# Patient Record
Sex: Female | Born: 2000
Health system: Southern US, Community
[De-identification: ages and names within clinical notes are randomized; demographics above are authoritative.]

## PROBLEM LIST (undated history)

## (undated) DIAGNOSIS — F419 Anxiety disorder, unspecified: Secondary | ICD-10-CM

---
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
## 2020-01-05 ENCOUNTER — Encounter: Payer: Self-pay | Admitting: Neurology

## 2020-01-05 ENCOUNTER — Other Ambulatory Visit: Payer: Self-pay

## 2020-01-05 ENCOUNTER — Ambulatory Visit: Payer: 59 | Admitting: Neurology

## 2020-01-05 VITALS — BP 124/72 | HR 100 | Temp 97.6°F | Ht 64.0 in | Wt 134.0 lb

## 2020-01-05 DIAGNOSIS — H532 Diplopia: Secondary | ICD-10-CM

## 2020-01-05 NOTE — Progress Notes (Signed)
Reason for visit: Diplopia  Referring physician: Dr. Glade Lloyd is a 19 y.o. female  History of present illness:  Ms. Lapage is a 54 year old right-handed white female with a 1 year onset of gradual worsening double vision.  The patient has more double vision when she tries to look up, she may have a component of horizontal and vertical changes.  The patient denies any significant issues with eye pain but she does have problems with headaches that are daily in nature and have been present for about a year concurrent with onset of the double vision.  She reports some underlying fatigue issues, she denies any numbness or weakness of the face, arms, legs.  She has noted that the interpalpebral fissure on the left eye is wider than the right.  This also started about 1 year ago.  The patient did have problems with strabismus as a child, this was corrected by using glasses.  The patient denies any speech or swallowing changes, she does report occasional night sweats.  She reports that she has always been a bit clumsy, she has not noted any change in balance or difficulty controlling the bowels or the bladder.  She has already undergone blood work to look for myasthenia gravis and TSH level was normal.  Acetylcholine receptor antibody panel was negative.  Past Medical History:  Diagnosis Date  . Anxiety     History reviewed. No pertinent surgical history.  Family History  Problem Relation Age of Onset  . Cancer Mother        Sarcoma    Social history:  reports that she has never smoked. She has never used smokeless tobacco. She reports that she does not drink alcohol or use drugs.  Medications:  Prior to Admission medications   Medication Sig Start Date End Date Taking? Authorizing Provider  amoxicillin (AMOXIL) 875 MG tablet Take 1 tablet (875 mg total) by mouth 2 (two) times daily. (Rx void after 07/27/18) 07/17/18  Yes Beese, Tera Mater, MD  benzonatate (TESSALON) 200 MG capsule  Take one cap by mouth at bedtime as needed for cough.  May repeat in 4 to 6 hours 07/17/18  Yes Beese, Tera Mater, MD  cetirizine (ZYRTEC) 10 MG tablet Take by mouth.   Yes [provider]  norgestimate-ethinyl estradiol (ORTHO-CYCLEN,SPRINTEC,PREVIFEM) 0.25-35 MG-MCG tablet Take 1 tablet by mouth daily.   Yes [provider]  ondansetron (ZOFRAN ODT) 4 MG disintegrating tablet Take one tab by mouth Q6hr prn nausea.  Dissolve under tongue. 07/17/18  Yes Lattie Haw, MD  sertraline (ZOLOFT) 25 MG tablet Take 25 mg by mouth daily.   Yes [provider]  triamcinolone cream (KENALOG) 0.1 % Apply 1 application topically 2 (two) times daily. 06/25/18  Yes Phelps, Erin O, PA-C     No Known Allergies  ROS:  Out of a complete 14 system review of symptoms, the patient complains only of the following symptoms, and all other reviewed systems are negative.  Double vision Fatigue Headache  Blood pressure 124/72, pulse 100, temperature 97.6 F (36.4 C), height 5\' 4"  (1.626 m), weight 134 lb (60.8 kg).  Physical Exam  General: The patient is alert and cooperative at the time of the examination.  Eyes: Pupils are equal, round, and reactive to light. Discs are flat bilaterally.  Venous pulsations are noted.  Neck: The neck is supple, no carotid bruits are noted.  Respiratory: The respiratory examination is clear.  Cardiovascular: The cardiovascular examination reveals a  regular rate and rhythm, no obvious murmurs or rubs are noted.  Skin: Extremities are without significant edema.  Neurologic Exam  Mental status: The patient is alert and oriented x 3 at the time of the examination. The patient has apparent normal recent and remote memory, with an apparently normal attention span and concentration ability.  Cranial nerves: Facial symmetry is present. There is good sensation of the face to pinprick and soft touch bilaterally. The strength of the facial muscles and the  muscles to head turning and shoulder shrug are normal bilaterally. Speech is well enunciated, no aphasia or dysarthria is noted. Extraocular movements are notable in that the left eye is inferior to the right in all fields, with primary gaze the eyes appear to be more conjugate but significant separation is seen with lateral gaze and superior gaze. Visual fields are full. The tongue is midline, and the patient has symmetric elevation of the soft palate. No obvious hearing deficits are noted.  Motor: The motor testing reveals 5 over 5 strength of all 4 extremities. Good symmetric motor tone is noted throughout.  Sensory: Sensory testing is intact to pinprick, soft touch, vibration sensation, and position sense on all 4 extremities. No evidence of extinction is noted.  Coordination: Cerebellar testing reveals good finger-nose-finger and heel-to-shin bilaterally.  Gait and station: Gait is normal. Tandem gait is normal. Romberg is negative. No drift is seen.  Reflexes: Deep tendon reflexes are symmetric, but are somewhat brisk bilaterally. Toes are downgoing bilaterally.   Assessment/Plan:  1.  Diplopia  2.  Questionable mild proptosis, OS  The patient will be sent for further blood work today.  MRI of the brain will be done.  If the studies are unrevealing, I will consider an empiric trial on Mestinon, although the clinical history and examination do not appear to be typical for myasthenia gravis.  Need to consider rule out the possibility of demyelinating disease.  She will follow-up here in about 3 months.   Jill Alexanders MD 01/05/2020 3:55 PM  [facility] [address] [place], [place] [address]  Phone [phone] Fax [phone]

## 2020-01-09 ENCOUNTER — Telehealth: Payer: Self-pay | Admitting: Neurology

## 2020-01-09 LAB — COMPREHENSIVE METABOLIC PANEL
ALT: 9 IU/L (ref 0–32)
AST: 15 IU/L (ref 0–40)
Albumin/Globulin Ratio: 1.5 (ref 1.2–2.2)
Albumin: 4.6 g/dL (ref 3.9–5.0)
Alkaline Phosphatase: 60 IU/L (ref 39–117)
BUN/Creatinine Ratio: 13 (ref 9–23)
BUN: 10 mg/dL (ref 6–20)
Bilirubin Total: 0.2 mg/dL (ref 0.0–1.2)
CO2: 23 mmol/L (ref 20–29)
Calcium: 10.1 mg/dL (ref 8.7–10.2)
Chloride: 101 mmol/L (ref 96–106)
Creatinine, Ser: 0.79 mg/dL (ref 0.57–1.00)
GFR calc Af Amer: 125 mL/min/{1.73_m2} (ref >59)
GFR calc non Af Amer: 109 mL/min/{1.73_m2} (ref >59)
Globulin, Total: 3 g/dL (ref 1.5–4.5)
Glucose: 92 mg/dL (ref 65–99)
Potassium: 4.1 mmol/L (ref 3.5–5.2)
Sodium: 139 mmol/L (ref 134–144)
Total Protein: 7.6 g/dL (ref 6.0–8.5)

## 2020-01-09 LAB — PAN-ANCA
ANCA Proteinase 3: <3.5 U/mL (ref 0.0–3.5)
Atypical pANCA: <1:20 {titer}
C-ANCA: <1:20 {titer}
Myeloperoxidase Ab: <9 U/mL (ref 0.0–9.0)
P-ANCA: <1:20 {titer}

## 2020-01-09 LAB — THYROID PEROXIDASE ANTIBODY: Thyroperoxidase Ab SerPl-aCnc: 52 IU/mL — ABNORMAL HIGH (ref 0–26)

## 2020-01-09 LAB — ANA W/REFLEX: Anti Nuclear Antibody (ANA): NEGATIVE

## 2020-01-09 LAB — B. BURGDORFI ANTIBODIES: Lyme IgG/IgM Ab: <0.91 {ISR} (ref 0.00–0.90)

## 2020-01-09 LAB — THYROGLOBULIN ANTIBODY: Thyroglobulin Antibody: 1.6 IU/mL — ABNORMAL HIGH (ref 0.0–0.9)

## 2020-01-09 LAB — VITAMIN B12: Vitamin B-12: 327 pg/mL (ref 232–1245)

## 2020-01-09 LAB — ANGIOTENSIN CONVERTING ENZYME: Angio Convert Enzyme: 47 U/L (ref 14–82)

## 2020-01-09 LAB — SEDIMENTATION RATE: Sed Rate: 6 mm/hr (ref 0–32)

## 2020-01-09 NOTE — Telephone Encounter (Signed)
I called the patient, I talked to the mother.  MRI of the brain is pending, blood work shows no abnormalities with exception of a slightly elevated thyroid peroxidase antibody and thyroglobulin antibody, the clinical significance of this is not clear.  If the MRI is unrevealing, I will set her up for second opinion with an endocrinologist.

## 2020-01-10 ENCOUNTER — Telehealth: Payer: Self-pay | Admitting: Neurology

## 2020-01-10 ENCOUNTER — Telehealth: Payer: Self-pay

## 2020-01-10 ENCOUNTER — Other Ambulatory Visit: Payer: Self-pay | Admitting: Neurology

## 2020-01-10 DIAGNOSIS — R768 Other specified abnormal immunological findings in serum: Secondary | ICD-10-CM

## 2020-01-10 NOTE — Telephone Encounter (Signed)
I talk to pts mom about the thyroid antibodies were elevated and Dr.Ahern work in [facility] recommend referral to endocrinology for autoimmune thyroid disease. The mom is in agreement and wants the referral sent. She verbalized understanding.

## 2020-01-10 NOTE — Telephone Encounter (Signed)
[employer] Auth: [employer] via [employer] website order faxed to [facility] imaging. Ph # [phone] & [phone].They will reach out to the patient to schedule.

## 2020-01-10 NOTE — Telephone Encounter (Signed)
Done, thanks

## 2020-01-21 ENCOUNTER — Ambulatory Visit: Payer: 59 | Attending: Internal Medicine

## 2020-01-21 DIAGNOSIS — Z23 Encounter for immunization: Secondary | ICD-10-CM

## 2020-01-21 NOTE — Progress Notes (Signed)
   Covid-19 Vaccination Clinic  Name:  Laura Burgess    MRN: [id number] DOB: 2001-07-29  01/21/2020  Laura Burgess was observed post Covid-19 immunization for 15 minutes without incident. She was provided with Vaccine Information Sheet and instruction to access the V-Safe system.   Laura Burgess was instructed to call 911 with any severe reactions post vaccine: Marland Kitchen Difficulty breathing  . Swelling of face and throat  . A fast heartbeat  . A bad rash all over body  . Dizziness and weakness   Immunizations Administered    Name Date Dose VIS Date Route   Pfizer COVID-19 Vaccine 01/21/2020  9:15 AM 0.3 mL 09/30/2019 Intramuscular   Manufacturer: [employer], [employer]   Lot: [id number]   NDC: [address]-9

## 2020-02-03 ENCOUNTER — Encounter: Payer: Self-pay | Admitting: Internal Medicine

## 2020-02-03 ENCOUNTER — Ambulatory Visit: Payer: 59 | Admitting: Internal Medicine

## 2020-02-03 ENCOUNTER — Other Ambulatory Visit: Payer: Self-pay

## 2020-02-03 VITALS — BP 116/68 | HR 96 | Temp 98.6°F | Ht 64.0 in | Wt 131.2 lb

## 2020-02-03 DIAGNOSIS — E0590 Thyrotoxicosis, unspecified without thyrotoxic crisis or storm: Secondary | ICD-10-CM | POA: Diagnosis not present

## 2020-02-03 DIAGNOSIS — R768 Other specified abnormal immunological findings in serum: Secondary | ICD-10-CM

## 2020-02-03 DIAGNOSIS — H499 Unspecified paralytic strabismus: Secondary | ICD-10-CM

## 2020-02-03 DIAGNOSIS — R7689 Other specified abnormal immunological findings in serum: Secondary | ICD-10-CM | POA: Insufficient documentation

## 2020-02-03 DIAGNOSIS — L80 Vitiligo: Secondary | ICD-10-CM | POA: Diagnosis not present

## 2020-02-03 NOTE — Progress Notes (Signed)
Name: Laura Burgess  MRN/ DOB: [id number], 2000/12/23    Age/ Sex: 19 y.o., female    PCP: Chales Salmon, MD   Reason for Endocrinology Evaluation: Elevated Anti-TPO Antibodies     Date of Initial Endocrinology Evaluation: 02/06/2020     HPI: Laura Burgess is a 19 y.o. female with a past medical history of Anxiety . The patient presented for initial endocrinology clinic visit on 02/06/2020 for consultative assistance with her Elevated Anti- TPO antibody   Pt was noted to have a slight elevation of Anti-TPO antibody during evaluation of headaches and diplopia.   She had fatigue that was started in 2019.  As a child she had right strabismus which was corrected with glasses.  Diplopia early in 01/2019 , this has been progressive  Headaches and eye pain started early in 2020   Weight has been stable  Anxiety is worse  Denies diarrhea  Has tremors worse with anxiety  Has occasional palpitations Denies tingling and numbness     Maternal great grand mother with thyroid disease    HISTORY:  Past Medical History:  Past Medical History:  Diagnosis Date  . Anxiety     Past Surgical History: No past surgical history on file.   Social History:  reports that she has never smoked. She has never used smokeless tobacco. She reports that she does not drink alcohol or use drugs.  Family History: family history includes Cancer in her mother.   HOME MEDICATIONS: Allergies as of 02/03/2020   No Known Allergies     Medication List       Accurate as of February 03, 2020 11:59 PM. If you have any questions, ask your nurse or doctor.        STOP taking these medications   ondansetron 4 MG disintegrating tablet Commonly known as: Zofran ODT Stopped by: Scarlette Shorts, MD     TAKE these medications   sertraline 25 MG tablet Commonly known as: ZOLOFT Take 25 mg by mouth daily.   Sprintec 28 0.25-35 MG-MCG tablet Generic drug: norgestimate-ethinyl estradiol What changed:  Another medication with the same name was removed. Continue taking this medication, and follow the directions you see here. Changed by: Scarlette Shorts, MD   VITAMIN D PO Take 3,000 Units by mouth.         REVIEW OF SYSTEMS: A comprehensive ROS was conducted with the patient and is negative except as per HPI   OBJECTIVE:  VS: BP 116/68 (BP Location: Left Arm, Patient Position: Sitting, Cuff Size: Normal)   Pulse 96   Temp 98.6 F (37 C)   Ht 5\' 4"  (1.626 m)   Wt 131 lb 3.2 oz (59.5 kg)   LMP 01/22/2020 (Exact Date)   SpO2 98%   BMI 22.52 kg/m    Wt Readings from Last 3 Encounters:  02/03/20 131 lb 3.2 oz (59.5 kg) (58 %, Z= 0.21)*  01/05/20 134 lb (60.8 kg) (63 %, Z= 0.34)*  07/17/18 128 lb (58.1 kg) (60 %, Z= 0.24)*   * Growth percentiles are based on CDC (Girls, 2-20 Years) data.     EXAM: General: Pt appears well and is in NAD  Eyes: External eye exam shows a disc conjugate gaze when looking upwards.  Patient with diplopia of the left eye upon abduction and inferior lateral movement. Patient with diplopia of the right eye upon abduction and inferior medial movement  Neck: General: Supple without adenopathy. Thyroid: Thyroid size normal.  No  goiter or nodules appreciated.  Lungs: Clear with good BS bilat with no rales, rhonchi, or wheezes  Heart: Auscultation: RRR.  Abdomen: Normoactive bowel sounds, soft, nontender, without masses or organomegaly palpable  Extremities:  BL LE: No pretibial edema normal ROM and strength.  Skin: Hair: Texture and amount normal with gender appropriate distribution Skin Inspection: No rashes, patient has a hyper pigmented macule on the right shin Skin Palpation: Skin temperature, texture, and thickness normal to palpation  Neuro: Cranial nerves: II - XII grossly intact  Motor: Normal strength throughout DTRs: Brisk reflexes  Mental Status: Judgment, insight: Intact Orientation: Oriented to time, place, and person Mood and  affect: No depression, anxiety, or agitation     DATA REVIEWED:   Results for Laura Burgess (MRN [id number]) as of 02/03/2020 15:34  Ref. Range 01/05/2020 15:59  Thyroperoxidase Ab SerPl-aCnc Latest Ref Range: 0 - 26 IU/mL 52 (H)  Thyroglobulin Antibody Latest Ref Range: 0.0 - 0.9 IU/mL 1.6 (H)  06/08/2018 TSH 0.43 uIU/mL    12/28/2019 TSH 1.35 mIU/mL FT4 1.3 ng/dL  ASSESSMENT/PLAN/RECOMMENDATIONS:   1. Elevated anti-TPO antibodies:  - I explained to the patient that Hashimoto's Disease is an autoimmune - mediated destruction of the thyroid gland. The usual course of Hashimoto's thyroiditis is the gradual loss of thyroid function. Overt hypothyroidism occurs at a rate of ~ 5% per year.   2. Subclinical Hyperthyroidism:   - In 2019 she did have a low TSH with normalization in 12/2029. This could be subclinical Grave's disease vs essay interference , another differential includes subacute thyroiditis , but the clinical scenario is not consistent with this. -No treatment is needed at this time. - TRAb is pending  -Patient is clinically euthyroid  2.  Ophthalmoplegia:  -Patient has been evaluated by neurology, with negative MRI of the brain.  Negative myasthenia gravis antibody levels - Pt states she was told the differential includes graves' disease vs Myasthenia Gravis -I strongly recommend the patient has an orbital MRI or a CT scan, I explained to the patient that orbital imaging  is beyond the scope of endocrinology - We also discussed that the role of endocrinology is to treat Graves' Thyroid disease but  NOT Graves' orbitopathy.    3.  Vitiligo:  Patient does endorse enlargement of the hypopigmented area over the past year.  We discussed that vitiligo is an autoimmune condition , which could mean that she is at risk for developing other autoimmune condition such as Graves' disease, Hashimoto, and myasthenia gravis.   Addendum: Lab results discussed with patient and mother on  02/06/2020 at about 1630.  We discussed that TSH levels are minimally decreased and this is usually not associated with any clinical symptoms.  I again emphasized the importance of seeking ophthalmology consultation for the possibility of Graves' orbitopathy, as this is beyond the scope of endocrinology I will update them when the TSI and TRAB  levels become available, but this is not going to change the course of treatment from the thyroid standpoint.   Repeat labs in 6 weeks  Signed electronically by: Mack Guise, MD  [facility] Endocrin[facility]  [facility] [address], [place] [address] Phone: [phone] FAX: [phone]   CC: [address], [address] [place] [address] Phone: [phone] Fax: [phone]   Return to Endocrinology clinic as below: Future Appointments  Date Time Provider Faith  02/14/2020  9:30 AM [place]

## 2020-02-03 NOTE — Patient Instructions (Signed)
-   I would recommend annual thyroid function checks, this could be done by your primary care physician

## 2020-02-06 ENCOUNTER — Encounter: Payer: Self-pay | Admitting: Internal Medicine

## 2020-02-06 NOTE — Telephone Encounter (Signed)
Spoke to pt and mother and discussed slighly low TSh and normal FT4. No treatment needed at this time . I have encouraged them to discuss an orbital MRI with ophthalmology.   Awaiting on TRab and TSI levels     They both expressed understanding    Abby Raelyn Mora, MD  [facility] Endocrinology  [facility] [address] [place], [place] [address] Phone: [phone] FAX: [phone]

## 2020-02-07 ENCOUNTER — Ambulatory Visit: Payer: 59 | Admitting: Internal Medicine

## 2020-02-07 LAB — TSH: TSH: 0.46 mIU/L — ABNORMAL LOW

## 2020-02-07 LAB — TRAB (TSH RECEPTOR BINDING ANTIBODY): TRAB: <1 IU/L (ref <=2.00)

## 2020-02-07 LAB — THYROID STIMULATING IMMUNOGLOBULIN: TSI: <89 % baseline (ref <140)

## 2020-02-07 LAB — T4, FREE: Free T4: 1.4 ng/dL (ref 0.8–1.4)

## 2020-02-10 ENCOUNTER — Other Ambulatory Visit: Payer: Self-pay | Admitting: Neurology

## 2020-02-10 DIAGNOSIS — H532 Diplopia: Secondary | ICD-10-CM

## 2020-02-13 ENCOUNTER — Other Ambulatory Visit: Payer: Self-pay | Admitting: Neurology

## 2020-02-13 DIAGNOSIS — H532 Diplopia: Secondary | ICD-10-CM

## 2020-02-14 ENCOUNTER — Ambulatory Visit: Payer: 59 | Attending: Internal Medicine

## 2020-02-14 DIAGNOSIS — Z23 Encounter for immunization: Secondary | ICD-10-CM

## 2020-02-14 NOTE — Progress Notes (Signed)
   Covid-19 Vaccination Clinic  Name:  Kaja Jackowski    MRN: [id number] DOB: October 20, 2001  02/14/2020  Ms. Ohnemus was observed post Covid-19 immunization for 15 minutes without incident. She was provided with Vaccine Information Sheet and instruction to access the V-Safe system.   Ms. Raphael was instructed to call 911 with any severe reactions post vaccine: Marland Kitchen Difficulty breathing  . Swelling of face and throat  . A fast heartbeat  . A bad rash all over body  . Dizziness and weakness   Immunizations Administered    Name Date Dose VIS Date Route   Pfizer COVID-19 Vaccine 02/14/2020  9:21 AM 0.3 mL 12/14/2018 Intramuscular   Manufacturer: [employer], [employer]   Lot: [id number]   NDC: [address]-6

## 2020-02-16 ENCOUNTER — Telehealth: Payer: Self-pay | Admitting: Neurology

## 2020-02-16 NOTE — Telephone Encounter (Signed)
UHC Auth: [employer] via UHC website order faxed to [facility]. Ph # [phone] & [phone].They will reach out to the patient to schedule.

## 2020-02-24 ENCOUNTER — Ambulatory Visit: Payer: 59 | Admitting: Internal Medicine

## 2020-07-25 ENCOUNTER — Emergency Department
Admission: RE | Admit: 2020-07-25 | Discharge: 2020-07-25 | Disposition: A | Discharge status: Home / Self Care | Payer: 59 | Source: Ambulatory Visit

## 2020-07-25 ENCOUNTER — Other Ambulatory Visit: Payer: Self-pay

## 2020-07-25 VITALS — BP 101/68 | HR 124 | Temp 98.0°F | Resp 18

## 2020-07-25 DIAGNOSIS — H66002 Acute suppurative otitis media without spontaneous rupture of ear drum, left ear: Secondary | ICD-10-CM

## 2020-07-25 MED ORDER — CEPHALEXIN 500 MG PO CAPS: 500.0000 mg | ORAL_CAPSULE | Freq: Three times a day (TID) | ORAL | 0 refills | Status: AC

## 2020-07-25 MED ORDER — FLUTICASONE PROPIONATE 50 MCG/ACT NA SUSP: 2.0000 | Freq: Every day | NASAL | 12 refills | Status: DC

## 2020-07-25 NOTE — ED Provider Notes (Signed)
[facility]    CSN: [id number] Arrival date & time: 07/25/20  1402      History   Chief Complaint Chief Complaint  Patient presents with  . Otalgia    Bilateral    HPI Laura Burgess is a 19 y.o. female.   HPI  Patient presents with 3 days of bilateral pain with with pain in the left ear pain greater than right ear. Symptoms started 4 days ago.  She also endorses a left sided headache which she attributes to recent infusion she has been receiving her thyroid vaccines.  Denies sinus symptoms, nasal drainage, fever, no concern for COVID-19. Afebrile  Past Medical History:  Diagnosis Date  . Anxiety     Patient Active Problem List   Diagnosis Date Noted  . 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 Vitiligo 02/03/2020  . Anti-TPO antibodies present 02/03/2020    History reviewed. No pertinent surgical history.  OB History   No obstetric history on file.      Home Medications    Prior to Admission medications   Medication Sig Start Date End Date Taking? Authorizing Provider  Teprotumumab-trbw 500 MG SOLR Inject into the vein.   Yes [provider]  norgestimate-ethinyl estradiol (SPRINTEC 28) 0.25-35 MG-MCG tablet     [provider]  sertraline (ZOLOFT) 25 MG tablet Take 25 mg by mouth daily.    [provider]  VITAMIN D PO Take 3,000 Units by mouth.    [provider]    Family History Family History  Problem Relation Age of Onset  . Cancer Mother        Sarcoma    Social History Social History   Tobacco Use  . Smoking status: Never Smoker  . Smokeless tobacco: Never Used  Vaping Use  . Vaping Use: Never used  Substance Use Topics  . Alcohol use: Never  . Drug use: Never     Allergies   Patient has no known allergies. Review of Systems Review of Systems Pertinent negatives listed in HPI  Physical Exam Triage Vital Signs ED Triage Vitals [07/25/20 1418]  Enc Vitals Group     BP 101/68     Pulse Rate (!)  124     Resp 18     Temp 98 F (36.7 C)     Temp Source Oral     SpO2 98 %     Weight      Height      Head Circumference      Peak Flow      Pain Score 4     Pain Loc      Pain Edu?      Excl. in GC?    No data found.  Updated Vital Signs BP 101/68 (BP Location: Right Arm)   Pulse (!) 124   Temp 98 F (36.7 C) (Oral)   Resp 18   SpO2 98%   Visual Acuity Right Eye Distance:   Left Eye Distance:   Bilateral Distance:    Right Eye Near:   Left Eye Near:    Bilateral Near:     Physical Exam General Appearance:    Alert, cooperative, no distress, non-ill appearing   HENT:   ENT exam normal, no neck nodes or sinus tenderness and left TM red, dull, bulging  Eyes:    PERRLA, conjunctiva/corneas clear  Lungs:     Clear to auscultation bilaterally, respirations unlabored  Heart:    Increase heart rate  and rhythm  Neurologic:   Awake, alert, oriented x 3. No apparent focal neurological           defect.         UC Treatments / Results  Labs (all labs ordered are listed, but only abnormal results are displayed) Labs Reviewed - No data to display  EKG   Radiology No results found.  Procedures Procedures (including critical care time)  Medications Ordered in UC Medications - No data to display  Initial Impression / Assessment and Plan / UC Course  I have reviewed the triage vital signs and the nursing notes.  Pertinent labs & imaging results that were available during my care of the patient were reviewed by me and considered in my medical decision making (see chart for details).    Acute otitis media, left ear, right ear suspect ET tube dysfunction. Orders per medications below. Red flags discussed. Patient verbalized understanding and agreement with plan Final Clinical Impressions(s) / UC Diagnoses   Final diagnoses:  Acute suppurative otitis media of left ear without spontaneous rupture of tympanic membrane, recurrence not specified   Discharge  Instructions   None    ED Prescriptions    Medication Sig Dispense Auth. Provider   cephALEXin (KEFLEX) 500 MG capsule Take 1 capsule (500 mg total) by mouth 3 (three) times daily for 7 days. 21 capsule Bing Neighbors, FNP   fluticasone (FLONASE) 50 MCG/ACT nasal spray Place 2 sprays into both nostrils daily. 16 g Bing Neighbors, FNP     PDMP not reviewed this encounter.   Bing Neighbors, FNP 07/27/20 2149

## 2020-07-25 NOTE — ED Triage Notes (Signed)
Pt c/o bilateral ear pain since Sunday. Says started off as itchy, pain started last night. Pain 4/10   Pt recently dx with thyroid eye disease and has started infusions every 3 weeks. Has also been experiencing headaches and bodyaches, not sure if due to infusions or not.

## 2020-10-29 ENCOUNTER — Telehealth: Payer: Self-pay | Admitting: Neurology

## 2020-10-29 NOTE — Telephone Encounter (Signed)
Pt's mother, Debhora Titus (on [place]) called, spoke with Dr. Clarisa Kindred nurse this morning and she scheduled an appt for 2/1. She is having pain in her head, I'm trying to avoid the ED. Can she get a sooner appt? Could you have the nurse to call me?

## 2020-10-30 NOTE — Telephone Encounter (Signed)
Patient was seen at the ED and had some workup done, left after 2 hours LWOT.  Called today and found out she is Covid+, so they believe that is the cause of her headache.  She is planning on keeping her 2/1 appointment at this time.

## 2020-11-20 ENCOUNTER — Encounter: Payer: Self-pay | Admitting: Neurology

## 2020-11-20 ENCOUNTER — Ambulatory Visit: Payer: 59 | Admitting: Neurology

## 2020-11-20 VITALS — BP 134/74 | HR 93 | Ht 64.0 in | Wt 128.8 lb

## 2020-11-20 DIAGNOSIS — H499 Unspecified paralytic strabismus: Secondary | ICD-10-CM

## 2020-11-20 NOTE — Progress Notes (Signed)
Reason for visit: Double vision  Laura Burgess is an 20 y.o. female  History of present illness:  Ms. is a 20 year old right-handed white female with a greater than 1 year history of double vision.  The patient reports a history of strabismus as a child that was corrected with glasses.  The patient has been found to have minimal elevations in the thyroid peroxidase and thyroglobulin antibodies but no evidence of thyroiditis has been found and on MRI of the orbits and CT of the orbits, no definite thickening of the extraocular muscles was noted.  The patient was given a trial on Tepezza for thyroid eye disease, but this offered no benefit.  She was given a course of steroids as well.  The patient recently has come off of her medications to include Zoloft, and birth control pills.  Off of birth control pills she has had an improvement in her headaches.  The patient indicates that she tries to read or look at a computer screen, she will have eye discomfort and headache the next morning.  She has pulled out of school and she is not doing her usual activities.  She is not operating a motor vehicle.  She is being followed through neuro-ophthalmology at [facility], she has an appointment coming up in several weeks.  Past Medical History:  Diagnosis Date  . Anxiety     History reviewed. No pertinent surgical history.  Family History  Problem Relation Age of Onset  . Cancer Mother        Sarcoma    Social history:  reports that she has never smoked. She has never used smokeless tobacco. She reports that she does not drink alcohol and does not use drugs.   No Known Allergies  Medications:  Prior to Admission medications   Medication Sig Start Date End Date Taking? Authorizing Provider  fluticasone (FLONASE) 50 MCG/ACT nasal spray Place 2 sprays into both nostrils daily. 07/25/20   Bing Neighbors, FNP  norgestimate-ethinyl estradiol (ORTHO-CYCLEN) 0.25-35 MG-MCG tablet     [provider]   sertraline (ZOLOFT) 25 MG tablet Take 25 mg by mouth daily.    [provider]  Teprotumumab-trbw 500 MG SOLR Inject into the vein.    [provider]  VITAMIN D PO Take 3,000 Units by mouth.    [provider]    ROS:  Out of a complete 14 system review of symptoms, the patient complains only of the following symptoms, and all other reviewed systems are negative.  Double vision Eye pain  Blood pressure 134/74, pulse 93, height 5\' 4"  (1.626 m), weight 128 lb 12.8 oz (58.4 kg).  Physical Exam  General: The patient is alert and cooperative at the time of the examination.  Skin: No significant peripheral edema is noted.   Neurologic Exam  Mental status: The patient is alert and oriented x 3 at the time of the examination. The patient has apparent normal recent and remote memory, with an apparently normal attention span and concentration ability.   Cranial nerves: Facial symmetry is present. Speech is normal, no aphasia or dysarthria is noted. Extraocular movements are full when each eyes tested individually.  When both eyes are open, the right eye deviates upward and medially, when the left eye is covered, the right eye immediately returns to a neutral position and demonstrates full normal extraocular movements. Visual fields are full.  Motor: The patient has good strength in all 4 extremities.  Sensory examination: Soft touch sensation  is symmetric on the face, arms, and legs.  Coordination: The patient has good finger-nose-finger and heel-to-shin bilaterally.  Gait and station: The patient has a normal gait. Tandem gait is normal. Romberg is negative. No drift is seen.  Reflexes: Deep tendon reflexes are symmetric.   Assessment/Plan:  1.  Diplopia  The patient clearly demonstrates disconjugate gaze but the ability to move the right eye is fully normal unless both eyes are uncovered.  When the left eye is covered the right eye movements are  completely normal.  My strongest clinical suspicion is that the patient is voluntarily producing disconjugate gaze.  The possibility of a dystonia affecting the eye muscles I suppose needs to be considered.  The patient's clinical examination otherwise is completely normal.  The patient likely does not have thyroid eye disease or myasthenia as the clinical examination is completely inconsistent with this.  I look forward to seeing the results of the upcoming neuro-ophthalmology evaluation.  She will follow up here in 6 months.  Marlan Palau MD 11/20/2020 1:29 PM  [facility] [address] [place], [place] [address]  Phone [phone] Fax [phone]

## 2021-05-22 ENCOUNTER — Ambulatory Visit: Payer: 59 | Admitting: Neurology

## 2022-03-20 ENCOUNTER — Emergency Department
Admission: RE | Admit: 2022-03-20 | Discharge: 2022-03-20 | Disposition: A | Discharge status: Home / Self Care | Payer: 59 | Source: Ambulatory Visit

## 2022-03-20 VITALS — BP 115/75 | HR 80 | Temp 98.1°F | Resp 18

## 2022-03-20 DIAGNOSIS — R519 Headache, unspecified: Secondary | ICD-10-CM | POA: Diagnosis not present

## 2022-03-20 MED ORDER — PREDNISONE 20 MG PO TABS: ORAL_TABLET | ORAL | 0 refills | Status: DC

## 2022-03-20 MED ORDER — RIZATRIPTAN BENZOATE 10 MG PO TABS: 10.0000 mg | ORAL_TABLET | ORAL | 0 refills | Status: DC | PRN

## 2022-03-20 NOTE — ED Provider Notes (Signed)
[facility]    CSN: [id number] Arrival date & time: 03/20/22  1556      History   Chief Complaint Chief Complaint  Patient presents with   Facial Pain    Sinus pressure    HPI Laura Burgess is a 21 y.o. female.   HPI 21 year old female presents with left facial pain for 2 days. PMH significant for anxiety and ophthalmoplegia.  Patient reports was evaluated by dentist yesterday and adjusted mouthguard due to grinding teeth at night.  Patient is accompanied by her Mother this morning afternoon.  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 No pertinent surgical history.  OB History   No obstetric history on file.      Home Medications    Prior to Admission medications   Medication Sig Start Date End Date Taking? Authorizing Provider  predniSONE (DELTASONE) 20 MG tablet Take 3 tabs PO daily x 5 days. 03/20/22  Yes Trevor Iha, FNP  rizatriptan (MAXALT) 10 MG tablet Take 1 tablet (10 mg total) by mouth as needed for migraine. May repeat in 2 hours if needed 03/20/22  Yes Trevor Iha, FNP    Family History Family History  Problem Relation Age of Onset   Cancer Mother        Sarcoma    Social History Social History   Tobacco Use   Smoking status: Never   Smokeless tobacco: Never  Vaping Use   Vaping Use: Never used  Substance Use Topics   Alcohol use: Never   Drug use: Never     Allergies   Codeine   Review of Systems Review of Systems  HENT:  Positive for congestion, sinus pressure and sinus pain.     Physical Exam Triage Vital Signs ED Triage Vitals  Enc Vitals Group     BP      Pulse      Resp      Temp      Temp src      SpO2      Weight      Height      Head Circumference      Peak Flow      Pain Score      Pain Loc      Pain Edu?      Excl.  in GC?    No data found.  Updated Vital Signs BP 115/75 (BP Location: Left Arm)   Pulse 80   Temp 98.1 F (36.7 C) (Oral)   Resp 18   SpO2 100%      Physical Exam Vitals and nursing note reviewed.  Constitutional:      Appearance: Normal appearance. She is normal weight.  HENT:     Head: Normocephalic and atraumatic.     Right Ear: Tympanic membrane and external ear normal.     Left Ear: Tympanic membrane and external ear normal.     Ears:     Comments: Moderate eustachian tube dysfunction noted bilaterally    Mouth/Throat:     Mouth: Mucous membranes are moist.     Pharynx: Oropharynx is clear.  Eyes:     Extraocular Movements: Extraocular movements intact.     Conjunctiva/sclera: Conjunctivae normal.     Pupils: Pupils are equal, round, and reactive to light.  Cardiovascular:  Rate and Rhythm: Normal rate and regular rhythm.     Pulses: Normal pulses.     Heart sounds: Normal heart sounds. No murmur heard. Pulmonary:     Effort: Pulmonary effort is normal.     Breath sounds: Normal breath sounds. No wheezing, rhonchi or rales.  Musculoskeletal:     Cervical back: Normal range of motion and neck supple.  Lymphadenopathy:     Cervical: No cervical adenopathy.  Skin:    General: Skin is warm and dry.  Neurological:     General: No focal deficit present.     Mental Status: She is alert and oriented to person, place, and time.     UC Treatments / Results  Labs (all labs ordered are listed, but only abnormal results are displayed) Labs Reviewed - No data to display  EKG   Radiology No results found.  Procedures Procedures (including critical care time)  Medications Ordered in UC Medications - No data to display  Initial Impression / Assessment and Plan / UC Course  I have reviewed the triage vital signs and the nursing notes.  Pertinent labs & imaging results that were available during my care of the patient were reviewed by me and considered in my  medical decision making (see chart for details).     MDM: Left facial pain-Rx'd Prednisone, Maxalt. Instructed patient to take medication as directed with food to completion.  Advised patient to start prednisone burst tomorrow morning, Friday, 03/21/2022.  Advised may take Maxalt daily, as needed for migraine like symptoms/left facial pain.  Encourage patient increase daily water intake while taking these medications.  Advised patient if symptoms worsen and/or unresolved please follow-up with PCP or here for further evaluation.  Discharged home, hemodynamically stable. Final Clinical Impressions(s) / UC Diagnoses   Final diagnoses:  Left facial pain     Discharge Instructions      Instructed patient to take medication as directed with food to completion.  Advised patient to start prednisone burst tomorrow morning, Friday, 03/21/2022.  Advised may take Maxalt daily, as needed for migraine like symptoms/left facial pain.  Encourage patient increase daily water intake while taking these medications.  Advised patient if symptoms worsen and/or unresolved please follow-up with PCP or here for further evaluation.     ED Prescriptions     Medication Sig Dispense Auth. Provider   predniSONE (DELTASONE) 20 MG tablet Take 3 tabs PO daily x 5 days. 15 tablet Trevor Iha, FNP   rizatriptan (MAXALT) 10 MG tablet Take 1 tablet (10 mg total) by mouth as needed for migraine. May repeat in 2 hours if needed 10 tablet Trevor Iha, FNP      PDMP not reviewed this encounter.   Trevor Iha, FNP 03/20/22 1631

## 2022-03-20 NOTE — ED Triage Notes (Signed)
Pt c/o LT sided facial pain since Tuesday. Also says she feels pain in her LT jaw/teeth. Ibuprofen prn. Last does 1 pm today.

## 2022-03-20 NOTE — Discharge Instructions (Addendum)
Instructed patient to take medication as directed with food to completion.  Advised patient to start prednisone burst tomorrow morning, Friday, 03/21/2022.  Advised may take Maxalt daily, as needed for migraine like symptoms/left facial pain.  Encourage patient increase daily water intake while taking these medications.  Advised patient if symptoms worsen and/or unresolved please follow-up with PCP or here for further evaluation.

## 2023-10-10 ENCOUNTER — Ambulatory Visit: Payer: Self-pay

## 2023-10-11 ENCOUNTER — Ambulatory Visit
Admission: EM | Admit: 2023-10-11 | Discharge: 2023-10-11 | Disposition: A | Discharge status: Home / Self Care | Payer: 59 | Attending: Family Medicine | Admitting: Family Medicine

## 2023-10-11 ENCOUNTER — Other Ambulatory Visit: Payer: Self-pay

## 2023-10-11 DIAGNOSIS — J209 Acute bronchitis, unspecified: Secondary | ICD-10-CM

## 2023-10-11 LAB — POCT INFLUENZA A/B
Influenza A, POC: NEGATIVE
Influenza B, POC: NEGATIVE

## 2023-10-11 LAB — POC SARS CORONAVIRUS 2 AG -  ED: SARS Coronavirus 2 Ag: NEGATIVE

## 2023-10-11 MED ORDER — AZITHROMYCIN 250 MG PO TABS: ORAL_TABLET | ORAL | 0 refills | Status: DC

## 2023-10-11 MED ORDER — PROMETHAZINE-DM 6.25-15 MG/5ML PO SYRP: 5.0000 mL | ORAL_SOLUTION | Freq: Four times a day (QID) | ORAL | 0 refills | Status: DC | PRN

## 2023-10-11 NOTE — ED Triage Notes (Signed)
Sick since Wednesday, has had sore throat, congestion, cough, losing voice. Has had tylenol cold and flu, nyquil, robitussin dm, tessalon perles, ibuprofen, afrin.

## 2023-10-11 NOTE — Discharge Instructions (Signed)
Take the antibiotic as directed.  2 pills today then 1 a day until gone Take Promethazine DM as needed for severe cough.  This can cause drowsiness Continue to drink lots of water Continue to run a humidifier in the bedroom See your doctor if not improving by next week

## 2023-10-11 NOTE — ED Provider Notes (Signed)
[facility]    CSN: [id number] Arrival date & time: 10/11/23  1119      History   Chief Complaint Chief Complaint  Patient presents with   Cough    HPI Laura Burgess is a 22 y.o. female.   Patient has a harsh voice, sore throat, postnasal drip, cough for the last week.  Her chest rales when she coughs.  Chest hurts when she coughs.  Feels unable to take a deep breath.  No underlying asthma or allergies.  She is coughing up thick dark green and brown sputum.  Feels very tired.  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 No pertinent surgical history.  OB History   No obstetric history on file.      Home Medications    Prior to Admission medications   Medication Sig Start Date End Date Taking? Authorizing Provider  azithromycin (ZITHROMAX Z-PAK) 250 MG tablet Take two pills today followed by one a day until gone 10/11/23  Yes Eustace Moore, MD  levocetirizine (XYZAL) 5 MG tablet Take 5 mg by mouth every evening.   Yes [provider]  promethazine-dextromethorphan (PROMETHAZINE-DM) 6.25-15 MG/5ML syrup Take 5 mLs by mouth 4 (four) times daily as needed for cough. 10/11/23  Yes Eustace Moore, MD    Family History Family History  Problem Relation Age of Onset   Cancer Mother        Sarcoma    Social History Social History   Tobacco Use   Smoking status: Never   Smokeless tobacco: Never  Vaping Use   Vaping status: Never Used  Substance Use Topics   Alcohol use: Never   Drug use: Never     Allergies   Codeine   Review of Systems Review of Systems See HPI  Physical Exam Triage Vital Signs ED Triage Vitals  Encounter Vitals Group     BP 10/11/23 1126 114/71     Systolic BP Percentile --      Diastolic BP  Percentile --      Pulse Rate 10/11/23 1126 92     Resp 10/11/23 1126 16     Temp 10/11/23 1126 98.2 F (36.8 C)     Temp src --      SpO2 10/11/23 1126 99 %     Weight --      Height --      Head Circumference --      Peak Flow --      Pain Score 10/11/23 1129 4     Pain Loc --      Pain Education --      Exclude from Growth Chart --    No data found.  Updated Vital Signs BP 114/71   Pulse 92   Temp 98.2 F (36.8 C)   Resp 16   SpO2 99%      Physical Exam Constitutional:      General: She is not in acute distress.    Appearance: She is well-developed and normal weight. She is ill-appearing.  HENT:     Head: Normocephalic and atraumatic.     Right Ear: Tympanic membrane and ear canal normal.     Left Ear: Tympanic membrane and ear canal normal.     Nose: Congestion  present.     Mouth/Throat:     Pharynx: Posterior oropharyngeal erythema present.  Eyes:     Conjunctiva/sclera: Conjunctivae normal.     Pupils: Pupils are equal, round, and reactive to light.  Cardiovascular:     Rate and Rhythm: Normal rate and regular rhythm.     Heart sounds: Normal heart sounds.  Pulmonary:     Effort: Pulmonary effort is normal. No respiratory distress.     Breath sounds: Rhonchi present.  Abdominal:     General: There is no distension.     Palpations: Abdomen is soft.  Musculoskeletal:        General: Normal range of motion.     Cervical back: Normal range of motion.  Lymphadenopathy:     Cervical: No cervical adenopathy.  Skin:    General: Skin is warm and dry.  Neurological:     Mental Status: She is alert.      UC Treatments / Results  Labs (all labs ordered are listed, but only abnormal results are displayed) Labs Reviewed  POC SARS CORONAVIRUS 2 AG -  ED  POCT INFLUENZA A/B    EKG   Radiology No results found.  Procedures Procedures (including critical care time)  Medications Ordered in UC Medications - No data to display  Initial Impression /  Assessment and Plan / UC Course  I have reviewed the triage vital signs and the nursing notes.  Pertinent labs & imaging results that were available during my care of the patient were reviewed by me and considered in my medical decision making (see chart for details).     Final Clinical Impressions(s) / UC Diagnoses   Final diagnoses:  Acute bronchitis, unspecified organism     Discharge Instructions      Take the antibiotic as directed.  2 pills today then 1 a day until gone Take Promethazine DM as needed for severe cough.  This can cause drowsiness Continue to drink lots of water Continue to run a humidifier in the bedroom See your doctor if not improving by next week   ED Prescriptions     Medication Sig Dispense Auth. Provider   azithromycin (ZITHROMAX Z-PAK) 250 MG tablet Take two pills today followed by one a day until gone 6 tablet Eustace Moore, MD   promethazine-dextromethorphan (PROMETHAZINE-DM) 6.25-15 MG/5ML syrup Take 5 mLs by mouth 4 (four) times daily as needed for cough. 118 mL Eustace Moore, MD      PDMP not reviewed this encounter.   Eustace Moore, MD 10/11/23 [phone]

## 2024-06-01 ENCOUNTER — Ambulatory Visit
Admission: RE | Admit: 2024-06-01 | Discharge: 2024-06-01 | Disposition: A | Discharge status: Home / Self Care | Attending: Family Medicine | Admitting: Family Medicine

## 2024-06-01 VITALS — BP 103/69 | HR 107 | Temp 99.4°F | Resp 17

## 2024-06-01 DIAGNOSIS — R509 Fever, unspecified: Secondary | ICD-10-CM

## 2024-06-01 DIAGNOSIS — U071 COVID-19: Secondary | ICD-10-CM

## 2024-06-01 LAB — POC SARS CORONAVIRUS 2 AG -  ED: SARS Coronavirus 2 Ag: POSITIVE — AB

## 2024-06-01 MED ORDER — ONDANSETRON 4 MG PO TBDP: 4.0000 mg | ORAL_TABLET | Freq: Three times a day (TID) | ORAL | 0 refills | Status: AC | PRN

## 2024-06-01 MED ORDER — PAXLOVID (300/100) 20 X 150 MG & 10 X 100MG PO TBPK: 3.0000 | ORAL_TABLET | Freq: Two times a day (BID) | ORAL | 0 refills | Status: AC

## 2024-06-01 MED ORDER — ONDANSETRON 4 MG PO TBDP
4.0000 mg | ORAL_TABLET | Freq: Once | ORAL | Status: AC
  Administered 2024-06-01 (×2): 4 mg via ORAL

## 2024-06-01 NOTE — ED Provider Notes (Signed)
 [facility]    CSN: [id number] Arrival date & time: 06/01/24  1758      History   Chief Complaint Chief Complaint  Patient presents with   Fever   Cough   Nasal Congestion   Diarrhea    HPI Laura Burgess is a 23 y.o. female.   HPI 23 year old female presents with cough, fever, nausea, and congestion for 2 days.  Reports both parents tested positive for COVID.  PMH significant for anxiety and vitiligo.  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 No pertinent surgical history.  OB History   No obstetric history on file.      Home Medications    Prior to Admission medications   Medication Sig Start Date End Date Taking? Authorizing Provider  nirmatrelvir/ritonavir (PAXLOVID , 300/100,) 20 x 150 MG & 10 x 100MG  TBPK Take 3 tablets by mouth 2 (two) times daily for 5 days. Patient GFR is >90. Take nirmatrelvir (150 mg) two tablets twice daily for 5 days and ritonavir (100 mg) one tablet twice daily for 5 days. 06/01/24 06/06/24 Yes Teddy Sharper, FNP  ondansetron  (ZOFRAN -ODT) 4 MG disintegrating tablet Take 1 tablet (4 mg total) by mouth every 8 (eight) hours as needed for nausea or vomiting. 06/01/24  Yes Teddy Sharper, FNP  levocetirizine (XYZAL) 5 MG tablet Take 5 mg by mouth every evening.    [provider]    Family History Family History  Problem Relation Age of Onset   Cancer Mother        Sarcoma    Social History Social History   Tobacco Use   Smoking status: Never   Smokeless tobacco: Never  Vaping Use   Vaping status: Never Used  Substance Use Topics   Alcohol use: Never   Drug use: Never     Allergies   Codeine   Review of Systems Review of Systems  Constitutional:  Positive for fatigue and fever.  HENT:  Positive for congestion.    Gastrointestinal:  Positive for nausea.  All other systems reviewed and are negative.    Physical Exam Triage Vital Signs ED Triage Vitals  Encounter Vitals Group     BP      Girls Systolic BP Percentile      Girls Diastolic BP Percentile      Boys Systolic BP Percentile      Boys Diastolic BP Percentile      Pulse      Resp      Temp      Temp src      SpO2      Weight      Height      Head Circumference      Peak Flow      Pain Score      Pain Loc      Pain Education      Exclude from Growth Chart    No data found.  Updated Vital Signs BP 103/69 (BP Location: Right Arm)   Pulse (!) 107   Temp 99.4 F (37.4 C) (Oral)   Resp 17   LMP 05/11/2024 (Exact Date)   SpO2 98%   Physical Exam Vitals and nursing note reviewed.  Constitutional:      Appearance: Normal appearance. She is normal weight.  HENT:  Head: Normocephalic and atraumatic.     Mouth/Throat:     Mouth: Mucous membranes are moist.     Pharynx: Oropharynx is clear.  Eyes:     Extraocular Movements: Extraocular movements intact.     Conjunctiva/sclera: Conjunctivae normal.     Pupils: Pupils are equal, round, and reactive to light.  Cardiovascular:     Rate and Rhythm: Normal rate and regular rhythm.     Pulses: Normal pulses.     Heart sounds: Normal heart sounds.  Pulmonary:     Effort: Pulmonary effort is normal.     Breath sounds: Normal breath sounds. No wheezing, rhonchi or rales.  Musculoskeletal:        General: Normal range of motion.  Skin:    General: Skin is warm and dry.  Neurological:     General: No focal deficit present.     Mental Status: She is alert and oriented to person, place, and time. Mental status is at baseline.  Psychiatric:        Mood and Affect: Mood normal.        Behavior: Behavior normal.      UC Treatments / Results  Labs (all labs ordered are listed, but only abnormal results are displayed) Labs Reviewed  POC SARS CORONAVIRUS 2 AG -  ED -  Abnormal; Notable for the following components:      Result Value   SARS Coronavirus 2 Ag Positive (*)    All other components within normal limits    EKG   Radiology No results found.  Procedures Procedures (including critical care time)  Medications Ordered in UC Medications  ondansetron  (ZOFRAN -ODT) disintegrating tablet 4 mg (4 mg Oral Given 06/01/24 1825)    Initial Impression / Assessment and Plan / UC Course  I have reviewed the triage vital signs and the nursing notes.  Pertinent labs & imaging results that were available during my care of the patient were reviewed by me and considered in my medical decision making (see chart for details).     MDM: 1.  COVID-19-patient declined Paxlovid  prescription this evening.  Request prescription for Zofran  instead. Advised patient she is positive for COVID-19.  Paxlovid  prescription provided to patient.  2.  Fever, unspecified-advised may take OTC Tylenol 1000 mg every 6 hours for fever (oral temperature greater than 100.3).  Encouraged increase daily water intake to 64 ounces per day while taking these medications.  Advised if symptoms worsen and/or unresolved please follow-up with your PCP or here for further evaluation.  Final Clinical Impressions(s) / UC Diagnoses   Final diagnoses:  COVID-19  Fever, unspecified     Discharge Instructions      Advised patient she is positive for COVID-19.  Paxlovid  prescription provided to patient.  Advised may take OTC Tylenol 1000 mg every 6 hours for fever (oral temperature greater than 100.3).  Encouraged increase daily water intake to 64 ounces per day while taking these medications.  Advised if symptoms worsen and/or unresolved please follow-up with your PCP or here for further evaluation.     ED Prescriptions     Medication Sig Dispense Auth. Provider   nirmatrelvir/ritonavir (PAXLOVID , 300/100,) 20 x 150 MG & 10 x 100MG  TBPK Take 3 tablets by mouth 2 (two) times daily for 5  days. Patient GFR is >90. Take nirmatrelvir (150 mg) two tablets twice daily for 5 days and ritonavir (100 mg) one tablet twice daily for 5 days. 30 tablet Amairani Shuey, FNP   ondansetron  (ZOFRAN -ODT)  4 MG disintegrating tablet Take 1 tablet (4 mg total) by mouth every 8 (eight) hours as needed for nausea or vomiting. 15 tablet Kemonie Cutillo, FNP      PDMP not reviewed this encounter.   Teddy Sharper, FNP 06/01/24 1902

## 2024-06-01 NOTE — ED Triage Notes (Addendum)
 Pt here today with her dad c/o cough, congestion, low grade fever since Monday. Some nausea. Diarrhea last couple days. Taking ibuprofen, tylenol and dayquil prn. Both parents also just getting over illness. Dad had pos covid test but states test was years expired.

## 2024-06-01 NOTE — Discharge Instructions (Addendum)
 Advised patient she is positive for COVID-19.  Paxlovid  prescription provided to patient.  Advised may take OTC Tylenol 1000 mg every 6 hours for fever (oral temperature greater than 100.3).  Encouraged increase daily water intake to 64 ounces per day while taking these medications.  Advised if symptoms worsen and/or unresolved please follow-up with your PCP or here for further evaluation.
